# Patient Record
Sex: Male | Born: 2005
Health system: Southern US, Community
[De-identification: ages and names within clinical notes are randomized; demographics above are authoritative.]

## PROBLEM LIST (undated history)

## (undated) DIAGNOSIS — T7840XA Allergy, unspecified, initial encounter: Secondary | ICD-10-CM

## (undated) DIAGNOSIS — J45909 Unspecified asthma, uncomplicated: Secondary | ICD-10-CM

## (undated) HISTORY — DX: Allergy, unspecified, initial encounter: T78.40XA

## (undated) HISTORY — DX: Unspecified asthma, uncomplicated: J45.909

---
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
## 2024-05-09 ENCOUNTER — Encounter: Payer: Self-pay | Admitting: Pediatrics

## 2024-05-09 ENCOUNTER — Ambulatory Visit: Admitting: Pediatrics

## 2024-05-09 VITALS — BP 118/74 | Ht 73.0 in | Wt 206.3 lb

## 2024-05-09 DIAGNOSIS — Z23 Encounter for immunization: Secondary | ICD-10-CM | POA: Diagnosis not present

## 2024-05-09 DIAGNOSIS — Z1339 Encounter for screening examination for other mental health and behavioral disorders: Secondary | ICD-10-CM

## 2024-05-09 DIAGNOSIS — Z68.41 Body mass index (BMI) pediatric, 85th percentile to less than 95th percentile for age: Secondary | ICD-10-CM

## 2024-05-09 DIAGNOSIS — Z0000 Encounter for general adult medical examination without abnormal findings: Secondary | ICD-10-CM

## 2024-05-09 DIAGNOSIS — Z00129 Encounter for routine child health examination without abnormal findings: Secondary | ICD-10-CM

## 2024-05-09 NOTE — Progress Notes (Signed)
 Adolescent Well Care Visit Joshua Werner is a 18 y.o. male who is here for well care.    PCP:  Birdie Abran Hamilton, DO   History was provided by the patient and mother.  Confidentiality was discussed with the patient and, if applicable, with caregiver as well.    Current Issues: Current concerns include:  currently sophomore.   --history of asthma, has not needed albuterol  for years  Nutrition: Nutrition/Eating Behaviors: good eater, 3 meals/day plus snacks, eats all food groups, mainly drinks water, milk, limited sweet drinks  Adequate calcium in diet?: adequate Supplements/ Vitamins: creatine,   Exercise/ Media: Play any Sports?/ Exercise: active Screen Time:  > 2 hours-counseling provided Media Rules or Monitoring?: no  Sleep:  Sleep: 7-8hr  Social Screening: Lives with:  dorm Parental relations:  good Activities, Work, and Regulatory Affairs Officer?: yes Concerns regarding behavior with peers?  no Stressors of note: no  Education: School Name: college  School Grade: sophmore School performance: doing well; no concerns School Behavior: doing well; no concerns  Menstruation:   No LMP for male patient. Menstrual History: NA   Confidential Social History: Tobacco?  no Secondhand smoke exposure?  no Drugs/ETOH?  no  Sexually Active?  Not currently Pregnancy Prevention: discussed  Safe at home, in school & in relationships?  Yes Safe to self?  Yes   Screenings: Patient has a dental home: yes, has dentist, brush bid   following topics were discussed as part of anticipatory guidance healthy eating, exercise, and screen time.  PHQ-9 completed and results indicated no concerns  Physical Exam:  Vitals:   05/09/24 0939  BP: 118/74  Weight: 206 lb 5 oz (93.6 kg)  Height: 6' 1 (1.854 m)   BP 118/74   Ht 6' 1 (1.854 m)   Wt 206 lb 5 oz (93.6 kg)   BMI 27.22 kg/m  Body mass index: body mass index is 27.22 kg/m. Blood pressure %iles are not available for patients who  are 18 years or older.  Hearing Screening   500Hz  1000Hz  2000Hz  3000Hz  4000Hz  5000Hz   Right ear 20 20 20 20 20 20   Left ear 20 20 20 20 20 20    Vision Screening   Right eye Left eye Both eyes  Without correction 10/16 10/16   With correction       General Appearance:   alert, oriented, no acute distress, well nourished, and muscular build  HENT: Normocephalic, no obvious abnormality, conjunctiva clear  Mouth:   Normal appearing teeth, no obvious discoloration, dental caries, or dental caps  Neck:   Supple; thyroid: no enlargement, symmetric, no tenderness/mass/nodules  Chest Normal male  Lungs:   Clear to auscultation bilaterally, normal work of breathing  Heart:   Regular rate and rhythm, S1 and S2 normal, no murmurs;   Abdomen:   Soft, non-tender, no mass, or organomegaly  GU genitalia not examined  Musculoskeletal:   Tone and strength strong and symmetrical, all extremities , no scoliosis              Lymphatic:   No cervical adenopathy  Skin/Hair/Nails:   Skin warm, dry and intact, no rashes, no bruises or petechiae  Neurologic:   Strength, gait, and coordination normal and age-appropriate     Assessment and Plan:   1. Encounter for routine child health examination without abnormal findings   2. BMI (body mass index), pediatric, 85% to less than 95% for age      --obtain yearly labs below and will contact with  results when available.    BMI is appropriate for age:  elevated but skewed due to muscular build  Hearing screening result:normal Vision screening result: normal  Counseling provided for all of the vaccine components  Orders Placed This Encounter  Procedures   Flu vaccine trivalent PF, 6mos and older(Flulaval,Afluria,Fluarix,Fluzone)   CBC with Differential/Platelet   Comprehensive metabolic panel with GFR   T4, free   TSH   Lipid panel  --Indications, contraindications and side effects of vaccine/vaccines discussed with parent and parent verbally  expressed understanding and also agreed with the administration of vaccine/vaccines as ordered above  today.    Return in about 1 year (around 05/09/2025).SABRA  Abran Glendia Ro, DO

## 2024-05-09 NOTE — Patient Instructions (Signed)
 Well Child Nutrition, Young Adult The following information provides general nutrition recommendations. Talk with a health care provider or a diet and nutrition specialist (dietitian) if you have any questions. Nutrition The amount of food you need to eat every day depends on your age, sex, size, and activity level. To figure out your daily calorie needs, look for a calorie calculator online or talk with your health care provider. Balanced diet Eat a balanced diet. Try to include: Fruits. Aim for 1-2 cups a day. Examples of 1 cup of fruit include 1 large banana, 1 small apple, 8 large strawberries, 1 large orange,  cup (80 g) dried fruit, or 1 cup (250 mL) of 100% fruit juice. Eat a variety of whole fruits and 100% fruit juice. Choose fresh, canned, frozen, or dried forms. Choose canned fruit that has the lowest added sugar or no added sugar. Vegetables. Aim for 2-4 cups a day. Examples of 1 cup of vegetables include 2 medium carrots, 1 large tomato, 2 stalks of celery, or 2 cups (62 g) of raw leafy greens. Choose fresh, frozen, canned, and dried options. Eat vegetables of a variety of colors. Low-fat or fat-free dairy. Aim for 3 cups a day. Examples of 1 cup of dairy include 8 oz (230 mL) of milk, 8 oz (230 g) of yogurt, or 1 oz (44 g) of natural cheese. If you are unable to tolerate dairy (lactose intolerant) or you choose not to consume dairy, you may include fortified soy beverages (soy milk). Grains. Aim for 6-10 "ounce-equivalents" of grain foods (such as pasta, rice, and tortillas) a day. Examples of 1 ounce-equivalent of grains include 1 cup (60 g) of ready-to-eat cereal,  cup (79 g) of cooked rice, or 1 slice of bread. Of the grain foods that you eat each day, aim to include 3-5 ounce-equivalents of whole-grain options. Examples of whole grains include whole wheat, brown rice, wild rice, quinoa, and oats. Lean proteins. Aim for 5-7 ounce-equivalents a day. Eat a variety of protein foods,  including lean meats, seafood, poultry, eggs, legumes (beans and peas), nuts, seeds, and soy products. A cut of meat or fish that is the size of a deck of cards is about 3-4 ounce-equivalents (85 g). Foods that provide 1 ounce-equivalent of protein include 1 egg,  ounce (28 g) of nuts or seeds, or 1 tablespoon (16 g) of peanut butter. For more information and options for foods in a balanced diet, visit www.[employer].be Tips for healthy snacking A snack should not be the size of a full meal. Eat snacks that have 200 calories or less. Examples include:  whole-wheat pita with  cup (40 g) hummus. 2 or 3 slices of deli [place] wrapped around a cheese stick.  apple with 1 tablespoon (16 g) of peanut butter. 10 baked chips with salsa. Keep cut-up fruits and vegetables available at home and at school so they are easy to eat. Pack healthy snacks the night before or when you pack your lunch. Avoid pre-packaged foods. These tend to be higher in fat, sugar, and salt (sodium). Get involved with shopping, or ask the primary food shopper in your household to get healthy snacks that you like. Avoid chips, candy, cake, and soft drinks. Foods to avoid [place] or heavily processed foods, such as toaster pastries and microwaveable dinners. Drinks that contain a lot of sugar, such as sports drinks, sodas, and juice. Foods that contain a lot of fat, sodium, or sugar. Food safety Prepare your food safely: Wash your hands  after handling raw meats. Keep food preparation surfaces clean by washing them regularly with hot, soapy water. Keep raw meats separate from foods that are ready-to-eat, such as fruits and vegetables. Cook seafood, meat, poultry, and eggs to the recommended minimum safe internal temperature. Store foods at safe temperatures. In general: Keep cold foods at 71F (4C) or colder. Keep your freezer at 79F (-18C or 18 degrees below 0C) or colder. Keep hot foods at 171F (60C) or  warmer. Foods are no longer safe to eat when they have been at a temperature of 40-171F (4-60C) for more than 2 hours. Physical activity Try to get 150 minutes of moderate-intensity physical activity each week. Examples include walking briskly or bicycling slower than 10 miles an hour (16 km an hour). Do muscle-strengthening exercises on 2 or more days a week. If you find it difficult to fit regular physical activity into your schedule, try: Taking the stairs instead of the elevator. Parking your car farther from the entrance or at the back of the parking lot. Biking or walking to work or school. If you need to lose weight, you may need to reduce your daily calorie intake and increase your daily amount of physical activity. Check with your health care provider before you start a new diet and exercise plan. General instructions Do not skip meals, especially breakfast. Water is the ideal beverage. Aim to drink six 8-oz (240 mL) glasses of water each day. Avoid fad diets. These may affect your mood and growth. If you choose to drink alcohol: Drink in moderation. This means two drinks a day for men and one drink a day for women who are not pregnant. One drink equals 12 oz (355 mL) of beer, 5 oz (148 mL) of wine, or 1 oz (44 mL) of hard liquor. You may drink coffee. It is recommended that you limit coffee intake to three to five 8-oz (240 mL) cups a day (up to 400 mg of caffeine). If you are worried about your body image, talk with your parents, your health care provider, or another trusted adult like a coach or counselor. You may be at risk for developing an eating disorder. Eating disorders can lead to serious medical problems. Food allergies may cause you to have a reaction (such as a rash, diarrhea, or vomiting) after eating or drinking. Talk with your health care provider if you have concerns about food allergies. Summary Eat a balanced diet. Include fruits, vegetables, low-fat dairy, whole  grains, and lean proteins. Try to get 150 minutes of moderate-intensity physical activity each week, and do muscle-strengthening exercises on 2 or more days a week. Choose healthy snacks that are 200 calories or less. Drink plenty of water. Try to drink six 8-oz (240 mL) glasses a day. This information is not intended to replace advice given to you by your health care provider. Make sure you discuss any questions you have with your health care provider. Document Revised: 05/03/2021 Document Reviewed: 05/03/2021 Elsevier Patient Education  2024 [employer].

## 2024-05-10 LAB — LIPID PANEL
Cholesterol: 133 mg/dL (ref <170)
HDL: 55 mg/dL (ref >45)
LDL Cholesterol (Calc): 67 mg/dL (ref <110)
Non-HDL Cholesterol (Calc): 78 mg/dL (ref <120)
Total CHOL/HDL Ratio: 2.4 (calc) (ref <5.0)
Triglycerides: 38 mg/dL (ref <90)

## 2024-05-10 LAB — COMPREHENSIVE METABOLIC PANEL WITH GFR
AG Ratio: 1.8 (calc) (ref 1.0–2.5)
ALT: 15 U/L (ref 8–46)
AST: 20 U/L (ref 12–32)
Albumin: 4.4 g/dL (ref 3.6–5.1)
Alkaline phosphatase (APISO): 75 U/L (ref 46–169)
BUN: 16 mg/dL (ref 7–20)
CO2: 27 mmol/L (ref 20–32)
Calcium: 9.4 mg/dL (ref 8.9–10.4)
Chloride: 106 mmol/L (ref 98–110)
Creat: 1.2 mg/dL (ref 0.60–1.24)
Globulin: 2.5 g/dL (ref 2.1–3.5)
Glucose, Bld: 78 mg/dL (ref 65–99)
Potassium: 4.7 mmol/L (ref 3.8–5.1)
Sodium: 137 mmol/L (ref 135–146)
Total Bilirubin: 0.4 mg/dL (ref 0.2–1.1)
Total Protein: 6.9 g/dL (ref 6.3–8.2)
eGFR: 89 mL/min/1.73m2 (ref >=60)

## 2024-05-10 LAB — CBC WITH DIFFERENTIAL/PLATELET
Absolute Lymphocytes: 1318 {cells}/uL (ref 850–3900)
Absolute Monocytes: 454 {cells}/uL (ref 200–950)
Basophils Absolute: 19 {cells}/uL (ref 0–200)
Basophils Relative: 0.6 %
Eosinophils Absolute: 90 {cells}/uL (ref 15–500)
Eosinophils Relative: 2.8 %
HCT: 45.8 % (ref 39.4–51.1)
Hemoglobin: 15.3 g/dL (ref 13.2–17.1)
MCH: 29.1 pg (ref 27.0–33.0)
MCHC: 33.4 g/dL (ref 31.6–35.4)
MCV: 87.1 fL (ref 81.4–101.7)
MPV: 10.3 fL (ref 7.5–12.5)
Monocytes Relative: 14.2 %
Neutro Abs: 1318 {cells}/uL — ABNORMAL LOW (ref 1500–7800)
Neutrophils Relative %: 41.2 %
Platelets: 296 Thousand/uL (ref 140–400)
RBC: 5.26 Million/uL (ref 4.20–5.80)
RDW: 12.2 % (ref 11.0–15.0)
Total Lymphocyte: 41.2 %
WBC: 3.2 Thousand/uL — ABNORMAL LOW (ref 3.8–10.8)

## 2024-05-10 LAB — T4, FREE: Free T4: 1.1 ng/dL (ref 0.8–1.4)

## 2024-05-10 LAB — TSH: TSH: 0.63 m[IU]/L (ref 0.50–4.30)
